# Patient Record
Sex: Male | Born: 1970 | Hispanic: No | Marital: Married | Smoking: Never smoker
Health system: Southern US, Community
[De-identification: ages and names within clinical notes are randomized; demographics above are authoritative.]

---

## 2007-04-27 ENCOUNTER — Emergency Department (HOSPITAL_COMMUNITY): Admission: EM | Admit: 2007-04-27 | Discharge: 2007-04-28 | Payer: Self-pay | Admitting: Emergency Medicine

## 2008-09-11 IMAGING — CT CT PELVIS W/O CM
1 of 2 series · 13 of 32 positions shown, 19 images · non-contrast
Comparison: none

CLINICAL DATA: Abdominal pain. Blood in urine. Kidney stones.

ABDOMEN CT WITHOUT CONTRAST - URINARY STONE PROTOCOL
TECHNIQUE: Multidetector CT imaging of the abdomen was performed following the
urinary stone protocol.  No oral or intravenous contrast was administered.
TECHNIQUE: Multidetector CT imaging of the pelvis was performed following the

[Series 4: 160 stone 4.0 b70f st · axial · 0.63mm/px · z∈[-608,-244]mm · 13 of 107 slices shown, 19 images]
[im 8/107  soft-tissue]
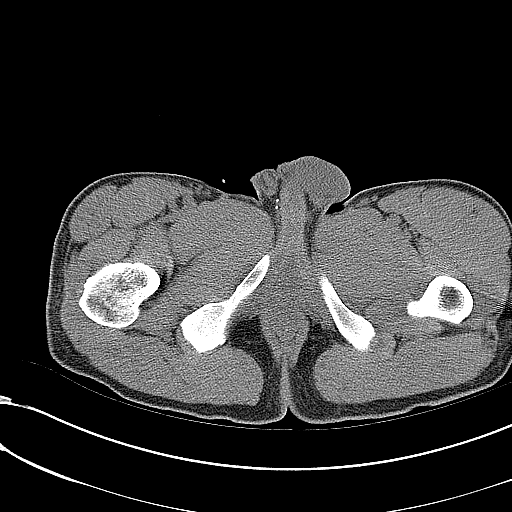
[im 8/107  bone]
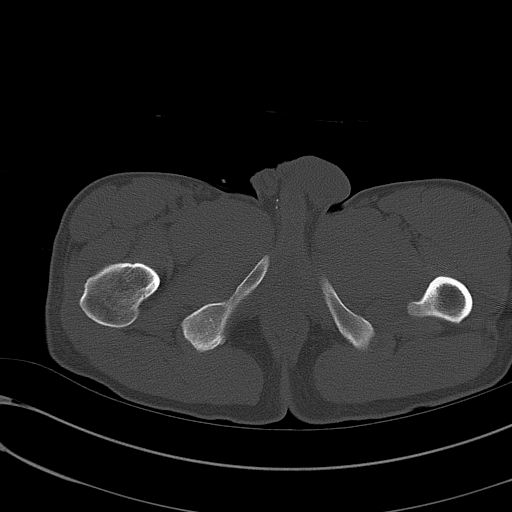
[im 15/107  soft-tissue]
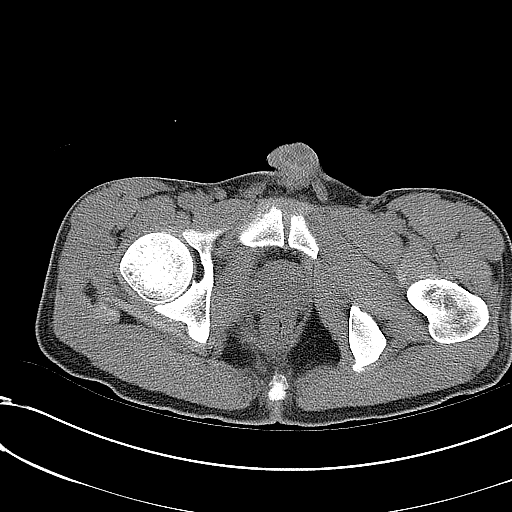
[im 22/107  soft-tissue]
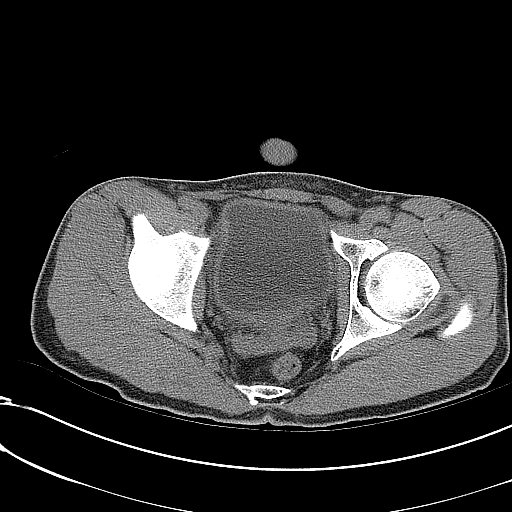
[im 29/107  soft-tissue]
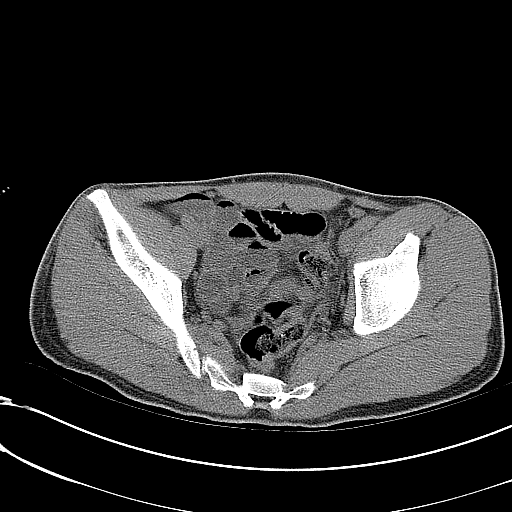
[im 36/107  soft-tissue]
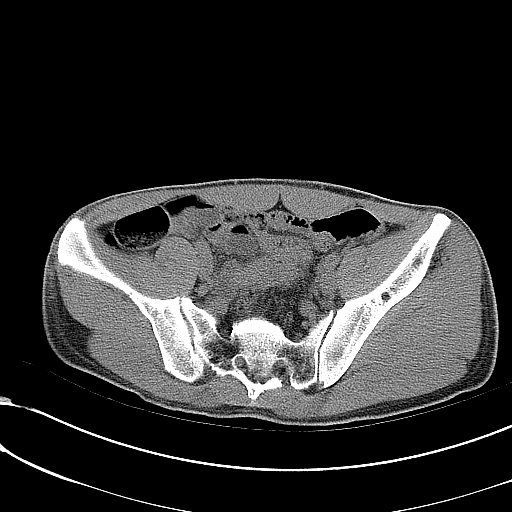
[im 43/107  soft-tissue]
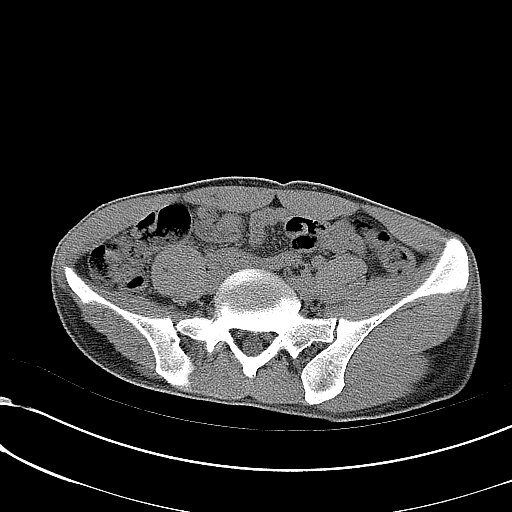
[im 57/107  soft-tissue]
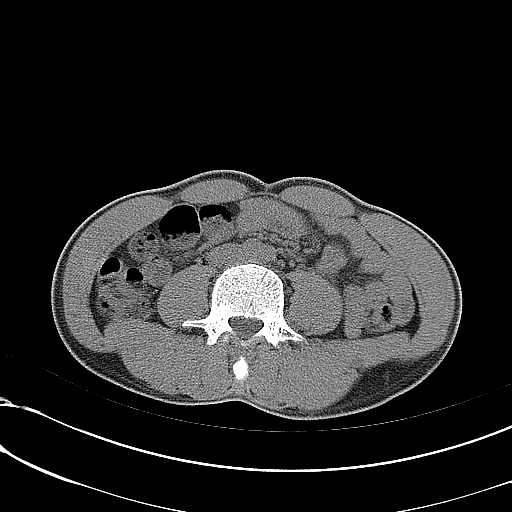
[im 64/107  soft-tissue]
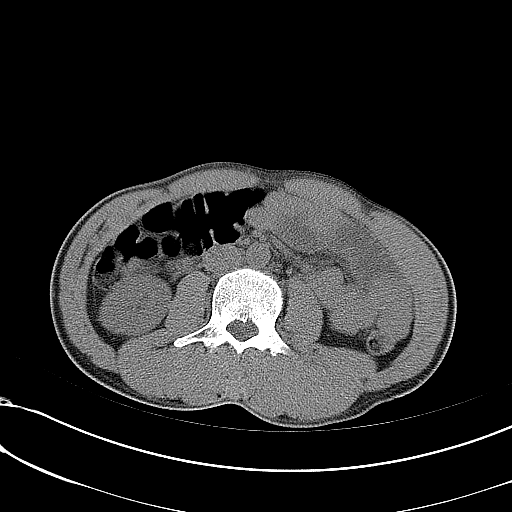
[im 71/107  soft-tissue]
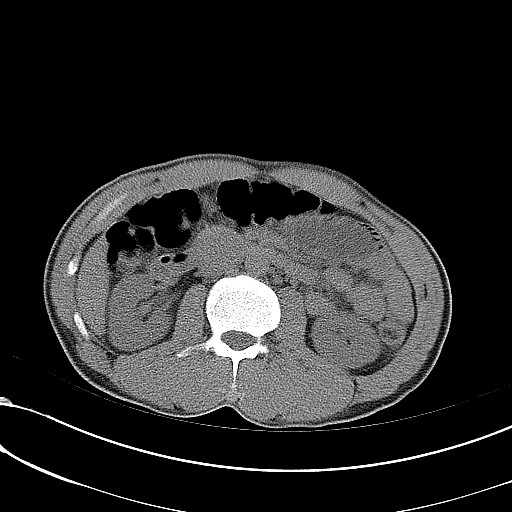
[im 71/107  bone]
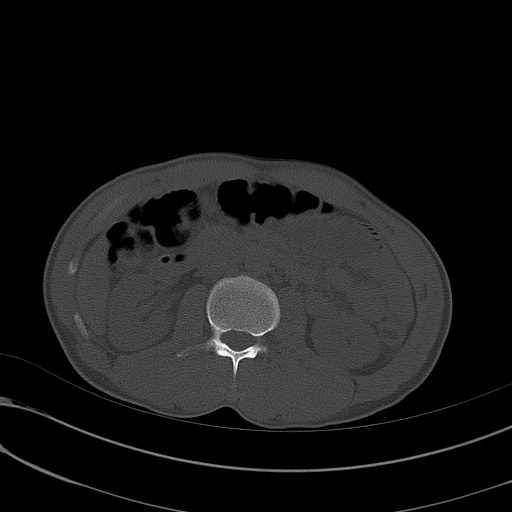
[im 78/107  soft-tissue]
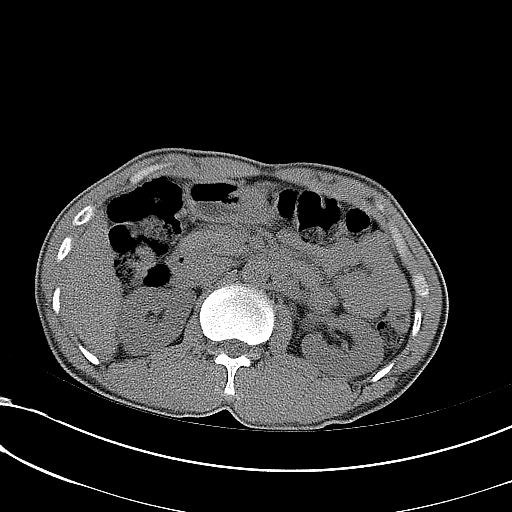
[im 78/107  lung]
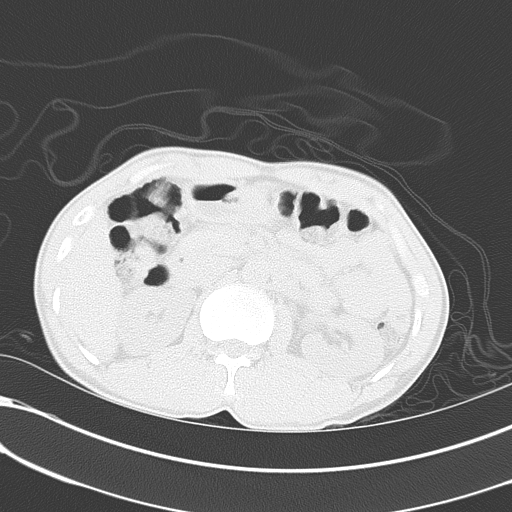
[im 85/107  soft-tissue]
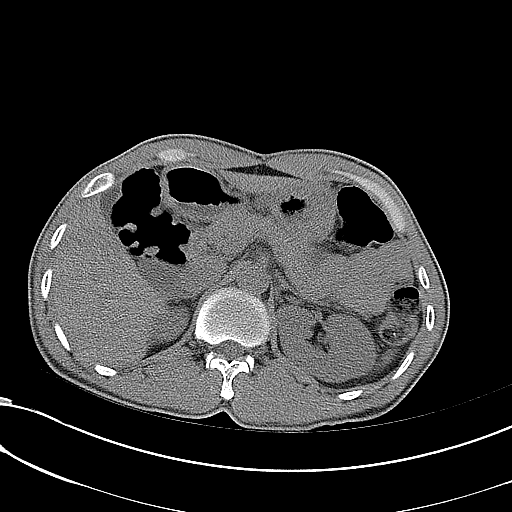
[im 85/107  lung]
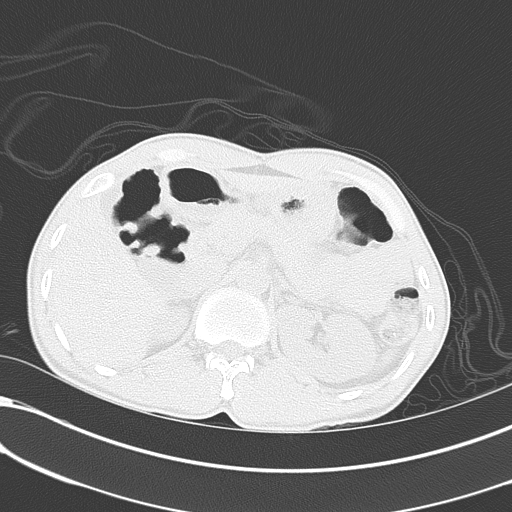
[im 92/107  soft-tissue]
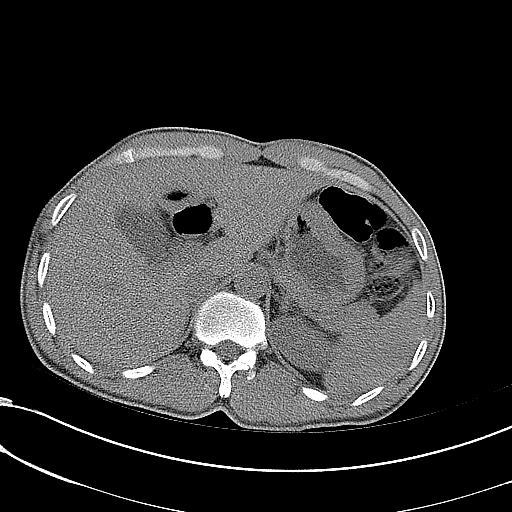
[im 92/107  lung]
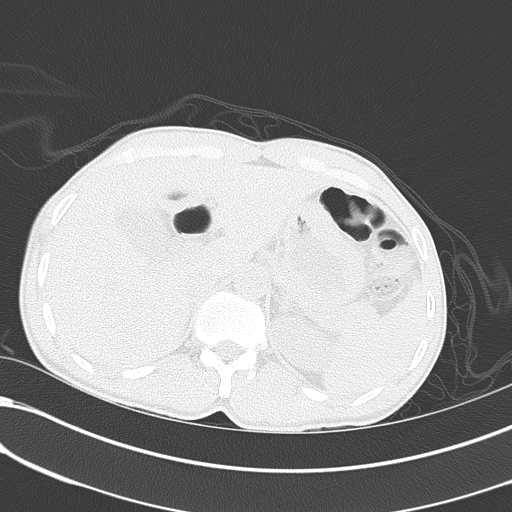
[im 99/107  soft-tissue]
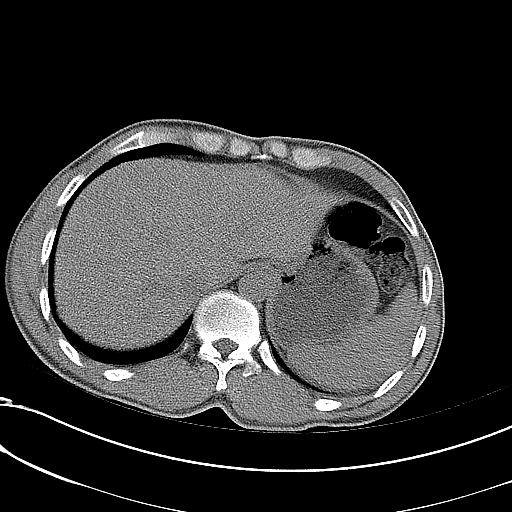
[im 99/107  lung]
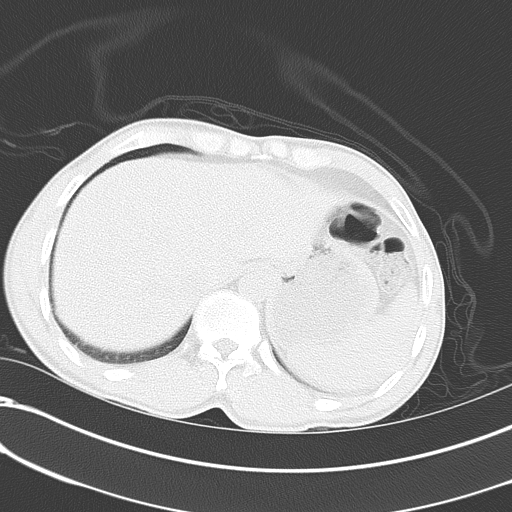

[13 of 32 positions shown; findings below may reference images not displayed]

FINDINGS: Noncontrast technique is limited evaluation of solid and hollow and
pelvis are.
Left kidney and ureter normal.
Right kidney and ureter normal. 
No stones, obstruction, or hydronephrosis.
No intra-abdominal free air identified.
Mildly distended loops of small bowel in the upper abdomen, without evidence of
obstruction.
*********************************************
IMPRESSION
1. No stones, obstruction, or hydronephrosis.
2. No acute intra-abdominal abnormality.
*********************************************
PELVIS CT WITHOUT CONTRAST - URINARY STONE PROTOCOL
FINDINGS: Normal appendix identified.
Bladder within normal limits.
No urinary stones identified.
Small bowel appears within normal limits.
*********************************************
IMPRESSION
1. No acute abnormalities  in the anatomic pelvis.
*********************************************

## 2011-01-23 ENCOUNTER — Inpatient Hospital Stay (INDEPENDENT_AMBULATORY_CARE_PROVIDER_SITE_OTHER)
Admission: RE | Admit: 2011-01-23 | Discharge: 2011-01-23 | Disposition: A | Discharge status: Home / Self Care | Payer: No Typology Code available for payment source | Source: Ambulatory Visit | Attending: Family Medicine | Admitting: Family Medicine

## 2011-01-23 DIAGNOSIS — R51 Headache: Secondary | ICD-10-CM

## 2011-01-23 LAB — GLUCOSE, CAPILLARY: Glucose-Capillary: 73 mg/dL (ref 70–99)

## 2011-01-26 ENCOUNTER — Inpatient Hospital Stay (INDEPENDENT_AMBULATORY_CARE_PROVIDER_SITE_OTHER)
Admission: RE | Admit: 2011-01-26 | Discharge: 2011-01-26 | Disposition: A | Discharge status: Home / Self Care | Payer: No Typology Code available for payment source | Source: Ambulatory Visit | Attending: Emergency Medicine | Admitting: Emergency Medicine

## 2011-01-26 DIAGNOSIS — G43009 Migraine without aura, not intractable, without status migrainosus: Secondary | ICD-10-CM

## 2011-03-04 LAB — URINALYSIS, ROUTINE W REFLEX MICROSCOPIC
Glucose, UA: NEGATIVE
Protein, ur: >300 — AB

## 2011-03-04 LAB — BASIC METABOLIC PANEL
CO2: 27
Chloride: 96
Creatinine, Ser: 0.79
Glucose, Bld: 96
Potassium: 3 — ABNORMAL LOW
Sodium: 137

## 2011-03-04 LAB — CBC
HCT: 45.3
MCHC: 33.9
MCV: 84.2
RBC: 5.38
RDW: 12.5

## 2011-03-04 LAB — DIFFERENTIAL
Basophils Relative: 0
Eosinophils Absolute: 0 — ABNORMAL LOW
Eosinophils Relative: 0
Lymphs Abs: 1
Monocytes Absolute: 0.6
Neutro Abs: 13.4 — ABNORMAL HIGH
Neutrophils Relative %: 89 — ABNORMAL HIGH

## 2011-03-04 LAB — URINE CULTURE: Colony Count: 60000

## 2014-08-03 ENCOUNTER — Emergency Department (HOSPITAL_COMMUNITY)
Admission: EM | Admit: 2014-08-03 | Discharge: 2014-08-03 | Disposition: A | Discharge status: Home / Self Care | Payer: Self-pay | Source: Home / Self Care | Attending: Family Medicine | Admitting: Family Medicine

## 2014-08-03 ENCOUNTER — Encounter (HOSPITAL_COMMUNITY): Payer: Self-pay | Admitting: Emergency Medicine

## 2014-08-03 DIAGNOSIS — L239 Allergic contact dermatitis, unspecified cause: Secondary | ICD-10-CM

## 2014-08-03 DIAGNOSIS — L200 Besnier's prurigo: Secondary | ICD-10-CM

## 2014-08-03 MED ORDER — PREDNISONE 10 MG PO KIT: PACK | ORAL | Status: AC

## 2014-08-03 NOTE — ED Provider Notes (Signed)
CSN: [id number]     Arrival date & time 08/03/14  1109 History   None    Chief Complaint  Patient presents with  . Rash   (Consider location/radiation/quality/duration/timing/severity/associated sxs/prior Treatment) HPI  Rash. Started 7 days ago. Whole body but started on his legs. Itchy. Getting worse. Has not tried anything to make it better. Someone gave him a mattress 3 mo ago. No one else w/ the rash (wife and 3 kids). No change in diet, soaps, lotions, detergents. Legs improving but back symptoms worsening.   Pt encounter aided by interpreter  History reviewed. No pertinent past medical history. History reviewed. No pertinent past surgical history. Family History  Problem Relation Age of Onset  . Diabetes Neg Hx   . Heart failure Neg Hx   . Hyperlipidemia Neg Hx   . Hypertension Neg Hx   . Cancer Neg Hx    History  Substance Use Topics  . Smoking status: Never Smoker   . Smokeless tobacco: Not on file  . Alcohol Use: No    Review of Systems Per HPI with all other pertinent systems negative.   Allergies  Review of patient's allergies indicates no known allergies.  Home Medications   Prior to Admission medications   Medication Sig Start Date End Date Taking? Authorizing Provider  PredniSONE 10 MG KIT 12 day dose pack 08/03/14   Waldemar Dickens, MD   BP 120/79 mmHg  Pulse 62  Temp(Src) 98 F (36.7 C) (Oral)  Resp 16  SpO2 98% Physical Exam  Constitutional: He is oriented to person, place, and time. He appears well-developed and well-nourished. No distress.  HENT:  Head: Normocephalic and atraumatic.  Eyes: EOM are normal. Pupils are equal, round, and reactive to light.  Neck: Normal range of motion.  Cardiovascular: Normal rate and normal heart sounds.   Pulmonary/Chest: Effort normal and breath sounds normal.  Abdominal: Soft. Bowel sounds are normal.  Musculoskeletal: Normal range of motion.  Neurological: He is alert and oriented to person, place, and  time.  Skin: He is not diaphoretic.  Mild macular papular rash on back.   Psychiatric: He has a normal mood and affect. His behavior is normal. Thought content normal.    ED Course  Procedures (including critical care time) Labs Review Labs Reviewed - No data to display  Imaging Review No results found.   MDM   1. Allergic dermatitis    Unclear etiology. Unlikely to be scabies or bedbugs or other significant autoimmune disorder. Start prednisone Dosepak. Benadryl at night when necessary. Precautions given and all questions answered  Linna Darner, MD Family Medicine 08/03/2014, 12:42 PM      Waldemar Dickens, MD 08/03/14 [phone]

## 2014-08-03 NOTE — ED Notes (Signed)
Via friend, Dalton Henson interpreter, Pt c/o rash all over onset 1 week Denies fevers, chills, new meds/hygiene products Alert, no signs of acute distress.

## 2014-08-03 NOTE — Discharge Instructions (Signed)
Your rash is from an allergic reaction. Please take the steroids for the entire duration. Please use Benadryl for additional itch relief. This can be purchased over-the-counter. Please come back if you're not better.
